# Patient Record
Sex: Male | Born: 1995 | Race: White | Hispanic: No | Marital: Single | Smoking: Current some day smoker
Health system: Southern US, Community
[De-identification: ages and names within clinical notes are randomized; demographics above are authoritative.]

---

## 2018-12-30 ENCOUNTER — Emergency Department
Admission: EM | Admit: 2018-12-30 | Discharge: 2018-12-30 | Disposition: A | Discharge status: Home / Self Care | Payer: PRIVATE HEALTH INSURANCE | Attending: Emergency Medicine | Admitting: Emergency Medicine

## 2018-12-30 ENCOUNTER — Encounter: Payer: Self-pay | Admitting: Emergency Medicine

## 2018-12-30 ENCOUNTER — Other Ambulatory Visit: Payer: Self-pay

## 2018-12-30 ENCOUNTER — Emergency Department: Payer: PRIVATE HEALTH INSURANCE

## 2018-12-30 DIAGNOSIS — R0781 Pleurodynia: Secondary | ICD-10-CM

## 2018-12-30 DIAGNOSIS — J020 Streptococcal pharyngitis: Secondary | ICD-10-CM | POA: Diagnosis not present

## 2018-12-30 DIAGNOSIS — R1012 Left upper quadrant pain: Secondary | ICD-10-CM | POA: Insufficient documentation

## 2018-12-30 DIAGNOSIS — Z20828 Contact with and (suspected) exposure to other viral communicable diseases: Secondary | ICD-10-CM | POA: Diagnosis not present

## 2018-12-30 DIAGNOSIS — J189 Pneumonia, unspecified organism: Secondary | ICD-10-CM | POA: Diagnosis not present

## 2018-12-30 DIAGNOSIS — F17200 Nicotine dependence, unspecified, uncomplicated: Secondary | ICD-10-CM | POA: Insufficient documentation

## 2018-12-30 DIAGNOSIS — M549 Dorsalgia, unspecified: Secondary | ICD-10-CM | POA: Diagnosis present

## 2018-12-30 LAB — URINALYSIS, COMPLETE (UACMP) WITH MICROSCOPIC
Bacteria, UA: NONE SEEN
Bilirubin Urine: NEGATIVE
Glucose, UA: NEGATIVE mg/dL
Hgb urine dipstick: NEGATIVE
Ketones, ur: 20 mg/dL — AB
Leukocytes,Ua: NEGATIVE
Nitrite: NEGATIVE
Protein, ur: 30 mg/dL — AB
Specific Gravity, Urine: 1.033 — ABNORMAL HIGH (ref 1.005–1.030)
pH: 6 (ref 5.0–8.0)

## 2018-12-30 LAB — COMPREHENSIVE METABOLIC PANEL
ALT: 14 U/L (ref 0–44)
AST: 17 U/L (ref 15–41)
Albumin: 4.2 g/dL (ref 3.5–5.0)
Alkaline Phosphatase: 66 U/L (ref 38–126)
Anion gap: 13 (ref 5–15)
BUN: 13 mg/dL (ref 6–20)
CO2: 22 mmol/L (ref 22–32)
Calcium: 9.3 mg/dL (ref 8.9–10.3)
Chloride: 102 mmol/L (ref 98–111)
Creatinine, Ser: 0.81 mg/dL (ref 0.61–1.24)
GFR calc Af Amer: >60 mL/min (ref >60)
GFR calc non Af Amer: >60 mL/min (ref >60)
Glucose, Bld: 121 mg/dL — ABNORMAL HIGH (ref 70–99)
Potassium: 3.9 mmol/L (ref 3.5–5.1)
Sodium: 137 mmol/L (ref 135–145)
Total Bilirubin: 1.4 mg/dL — ABNORMAL HIGH (ref 0.3–1.2)
Total Protein: 8.4 g/dL — ABNORMAL HIGH (ref 6.5–8.1)

## 2018-12-30 LAB — CBC WITH DIFFERENTIAL/PLATELET
Abs Immature Granulocytes: 0.06 10*3/uL (ref 0.00–0.07)
Basophils Absolute: 0 10*3/uL (ref 0.0–0.1)
Basophils Relative: 0 %
Eosinophils Absolute: 0.1 10*3/uL (ref 0.0–0.5)
Eosinophils Relative: 0 %
HCT: 40.1 % (ref 39.0–52.0)
Hemoglobin: 13.9 g/dL (ref 13.0–17.0)
Immature Granulocytes: 0 %
Lymphocytes Relative: 6 %
Lymphs Abs: 0.7 10*3/uL (ref 0.7–4.0)
MCH: 30.4 pg (ref 26.0–34.0)
MCHC: 34.7 g/dL (ref 30.0–36.0)
MCV: 87.7 fL (ref 80.0–100.0)
Monocytes Absolute: 1.5 10*3/uL — ABNORMAL HIGH (ref 0.1–1.0)
Monocytes Relative: 11 %
Neutro Abs: 11 10*3/uL — ABNORMAL HIGH (ref 1.7–7.7)
Neutrophils Relative %: 83 %
Platelets: 153 10*3/uL (ref 150–400)
RBC: 4.57 MIL/uL (ref 4.22–5.81)
RDW: 11.9 % (ref 11.5–15.5)
WBC: 13.4 10*3/uL — ABNORMAL HIGH (ref 4.0–10.5)
nRBC: 0 % (ref 0.0–0.2)

## 2018-12-30 LAB — MONONUCLEOSIS SCREEN: Mono Screen: NEGATIVE

## 2018-12-30 MED ORDER — SODIUM CHLORIDE 0.9 % IV BOLUS
1000.0000 mL | Freq: Once | INTRAVENOUS | Status: AC
  Administered 2018-12-30: 1000 mL via INTRAVENOUS

## 2018-12-30 MED ORDER — LEVOFLOXACIN 750 MG PO TABS: 750.0000 mg | ORAL_TABLET | Freq: Every day | ORAL | 0 refills | Status: AC

## 2018-12-30 MED ORDER — DEXAMETHASONE SODIUM PHOSPHATE 10 MG/ML IJ SOLN
10.0000 mg | Freq: Once | INTRAMUSCULAR | Status: AC
  Administered 2018-12-30: 10 mg via INTRAVENOUS
  Filled 2018-12-30: qty 1

## 2018-12-30 MED ORDER — ONDANSETRON HCL 4 MG/2ML IJ SOLN
4.0000 mg | Freq: Once | INTRAMUSCULAR | Status: AC
  Administered 2018-12-30: 4 mg via INTRAVENOUS
  Filled 2018-12-30: qty 2

## 2018-12-30 MED ORDER — MORPHINE SULFATE (PF) 4 MG/ML IV SOLN
4.0000 mg | Freq: Once | INTRAVENOUS | Status: AC
  Administered 2018-12-30: 4 mg via INTRAVENOUS
  Filled 2018-12-30: qty 1

## 2018-12-30 NOTE — ED Triage Notes (Signed)
Pt to ER via POV states recent diagnosis of strep and awoke this AM with left sided back/flank pain.  PT states pain is worse with dep breath and movement.

## 2018-12-30 NOTE — Discharge Instructions (Signed)
Please take the antibiotic as prescribed and until finished. Your COVID-19 test will be sent out and you will be called with the results. You must quarantine yourself for the next 7 days plus 3 days after fever without tylenol or ibuprofen. Return to the ER for symptoms that change or worsen if unable to see primary care.

## 2018-12-30 NOTE — ED Provider Notes (Signed)
[facility] Emergency Department Provider Note  ____________________________________________  Time seen: Approximately 1:44 PM  I have reviewed the triage vital signs and the nursing notes.   HISTORY  Chief Complaint Back Pain    HPI Eric Ramirez is a 23 y.o. male who presents to the emergency department for treatment and evaluation of sore throat and left upper quadrant abdominal pain. He was diagnosed with strep throat about 5 days ago and is currently taking amoxicillin. Over the past couple of days, he has felt worse. Today he has pain in the left upper abdomen with movement and deep breath. He reports an intermittent fever as well. No other sick contacts. No cough, shortness of breath or other symptoms of concern.  History reviewed. No pertinent past medical history.  There are no active problems to display for this patient.   History reviewed. No pertinent surgical history.  Prior to Admission medications   Medication Sig Start Date End Date Taking? Authorizing Provider  levofloxacin (LEVAQUIN) 750 MG tablet Take 1 tablet (750 mg total) by mouth daily for 7 days. 12/30/18 01/06/19  Chinita Pester, FNP    Allergies Patient has no allergy information on record.  History reviewed. No pertinent family history.  Social History Social History   Tobacco Use  . Smoking status: Current Some Day Smoker  . Smokeless tobacco: Never Used  Substance Use Topics  . Alcohol use: Not on file  . Drug use: Not on file    Review of Systems Constitutional: Positive for fever. Eyes: No visual changes. ENT: Positive for sore throat; negative for difficulty swallowing. Respiratory: Denies shortness of breath. Gastrointestinal: Positive for abdominal pain.  No nausea, no vomiting.  No diarrhea.  Genitourinary: Negative for dysuria. Positive for decrease in need to void. Musculoskeletal: Positive for generalized body aches. Skin: Negative for  rash. Neurological: Negative for headaches, negative for focal weakness or numbness.  ____________________________________________   PHYSICAL EXAM:  VITAL SIGNS: ED Triage Vitals  Enc Vitals Group     BP 12/30/18 1155 135/85     Pulse Rate 12/30/18 1155 86     Resp 12/30/18 1155 16     Temp 12/30/18 1155 98.8 F (37.1 C)     Temp Source 12/30/18 1155 Oral     SpO2 12/30/18 1155 97 %     Weight 12/30/18 1157 190 lb (86.2 kg)     Height 12/30/18 1157 6\' 5"  (1.956 m)     Head Circumference --      Peak Flow --      Pain Score 12/30/18 1156 0     Pain Loc --      Pain Edu? --      Excl. in GC? --     Constitutional: Alert and oriented. Well appearing and in no acute distress. Eyes: Conjunctivae are normal.  Head: Atraumatic. Nose: No congestion/rhinnorhea. Mouth/Throat: Mucous membranes are moist.  Oropharynx erythematous, tonsils 3+ with exudate and sloughing. Uvula is midline. Ears: Left tympanic membrane appears normal. Right tympanic membrane appears normal. Neck: No stridor. Voice slightly muffled. Lymphatic: Anterior cervical nodes Tender and palpable bilaterally. Cardiovascular: Normal rate, regular rhythm. Good peripheral circulation. Respiratory: Normal respiratory effort. Lungs CTAB. Gastrointestinal: Soft. Guarding in the left upper abdomen. Musculoskeletal: FROM of neck, upper and lower extremities. Neurologic:  Normal speech and language. No gross focal neurologic deficits are appreciated. Skin:  Skin is warm, dry and intact. No rash noted Psychiatric: Mood and affect are normal. Speech and behavior are  normal.  ____________________________________________   LABS (all labs ordered are listed, but only abnormal results are displayed)  Labs Reviewed  COMPREHENSIVE METABOLIC PANEL - Abnormal; Notable for the following components:      Result Value   Glucose, Bld 121 (*)    Total Protein 8.4 (*)    Total Bilirubin 1.4 (*)    All other components within  normal limits  CBC WITH DIFFERENTIAL/PLATELET - Abnormal; Notable for the following components:   WBC 13.4 (*)    Neutro Abs 11.0 (*)    Monocytes Absolute 1.5 (*)    All other components within normal limits  URINALYSIS, COMPLETE (UACMP) WITH MICROSCOPIC - Abnormal; Notable for the following components:   Color, Urine AMBER (*)    APPearance HAZY (*)    Specific Gravity, Urine 1.033 (*)    Ketones, ur 20 (*)    Protein, ur 30 (*)    All other components within normal limits  NOVEL CORONAVIRUS, NAA (HOSPITAL ORDER, SEND-OUT TO REF LAB)  MONONUCLEOSIS SCREEN  EPSTEIN BARR VRS(EBV DNA BY PCR)   ____________________________________________  EKG  Not indiated ____________________________________________  RADIOLOGY  Not indicated. ____________________________________________   PROCEDURES  Procedure(s) performed: None  Critical Care performed: No ____________________________________________   INITIAL IMPRESSION / ASSESSMENT AND PLAN / ED COURSE  23 year old male presenting to the emergency department for treatment and evaluation of sore throat and abdominal pain. Exam is concerning for strep and possibly concurrent mononucleosis. Because of the left upper abdominal pain, splenic ultrasound will be performed. Urine will also be requested.   Differential diagnosis includes, but not limited to strep pharyngitis, mononucleosis, splenomegaly, and glomerulonephritis.  ----------------------------------------- 2:00 PM on 12/30/2018 ----------------------------------------- Patient feeling some better and is requesting fluids to drink. Lab and ultrasound results reviewed with the patient. Will plan to discharge after urinalysis is resulted.   ----------------------------------------- 4:07 PM on 12/30/2018 ----------------------------------------- Case discussed with Dr. Cyril LoosenKinner. Chest x-ray added and shows left perihilar opacity concerning for pneumonia. COVID-19 testing will be  obtained for send out. He will be treated with Levaquin and advised to take tylenol for fever or body aches. He is to quarantine himself for 7 days plus 3 days after fever without tylenol. Patient requested that his results be discussed with his mother. I spoke to her on the phone in the presence of the patient. Patient is to return to the ER for symptoms that change or worsen or for new symptoms of concern.  Pertinent labs & imaging results that were available during my care of the patient were reviewed by me and considered in my medical decision making (see chart for details). ____________________________________________  Discharge Medication List as of 12/30/2018  3:53 PM    START taking these medications   Details  levofloxacin (LEVAQUIN) 750 MG tablet Take 1 tablet (750 mg total) by mouth daily for 7 days., Starting Sat 12/30/2018, Until Sat 01/06/2019, Print        FINAL CLINICAL IMPRESSION(S) / ED DIAGNOSES  Final diagnoses:  Left upper quadrant pain  Pharyngitis due to Streptococcus species  Pleuritic chest pain  Pneumonia of left lung due to infectious organism, unspecified part of lung    If controlled substance prescribed during this visit, 12 month history viewed on the [facility] prior to issuing an initial prescription for Schedule II or III opiod.   Note:  This document was prepared using Dragon voice recognition software and may include unintentional dictation errors.   Chinita Pesterriplett, Milam Allbaugh B, FNP 12/30/18 1712  Jene Every, MD 12/30/18 [phone]

## 2018-12-31 LAB — NOVEL CORONAVIRUS, NAA (HOSP ORDER, SEND-OUT TO REF LAB; TAT 18-24 HRS): SARS-CoV-2, NAA: NOT DETECTED

## 2019-01-01 NOTE — ED Notes (Signed)
Called patient and informed him of negative covid 19 result. 

## 2019-01-03 LAB — EPSTEIN BARR VRS(EBV DNA BY PCR)
EBV DNA QN by PCR: NEGATIVE copies/mL
log10 EBV DNA Qn PCR: UNDETERMINED log10 copy/mL

## 2019-05-08 ENCOUNTER — Other Ambulatory Visit: Payer: Self-pay

## 2019-05-08 DIAGNOSIS — Z20822 Contact with and (suspected) exposure to covid-19: Secondary | ICD-10-CM

## 2019-05-09 LAB — NOVEL CORONAVIRUS, NAA: SARS-CoV-2, NAA: NOT DETECTED

## 2019-05-31 ENCOUNTER — Other Ambulatory Visit: Payer: Self-pay | Admitting: *Deleted

## 2019-05-31 DIAGNOSIS — Z20822 Contact with and (suspected) exposure to covid-19: Secondary | ICD-10-CM

## 2019-06-01 LAB — NOVEL CORONAVIRUS, NAA: SARS-CoV-2, NAA: NOT DETECTED

## 2019-06-26 ENCOUNTER — Other Ambulatory Visit: Payer: Self-pay | Admitting: *Deleted

## 2019-06-26 DIAGNOSIS — Z20822 Contact with and (suspected) exposure to covid-19: Secondary | ICD-10-CM

## 2019-06-28 LAB — NOVEL CORONAVIRUS, NAA: SARS-CoV-2, NAA: NOT DETECTED

## 2020-11-22 IMAGING — DX PORTABLE CHEST - 1 VIEW
2 series · 2 of 2 positions shown · non-contrast
Comparison: None.

CLINICAL DATA: Back pain and flank pain.

EXAM:
PORTABLE CHEST 1 VIEW

[chest ap (1 of 2)]
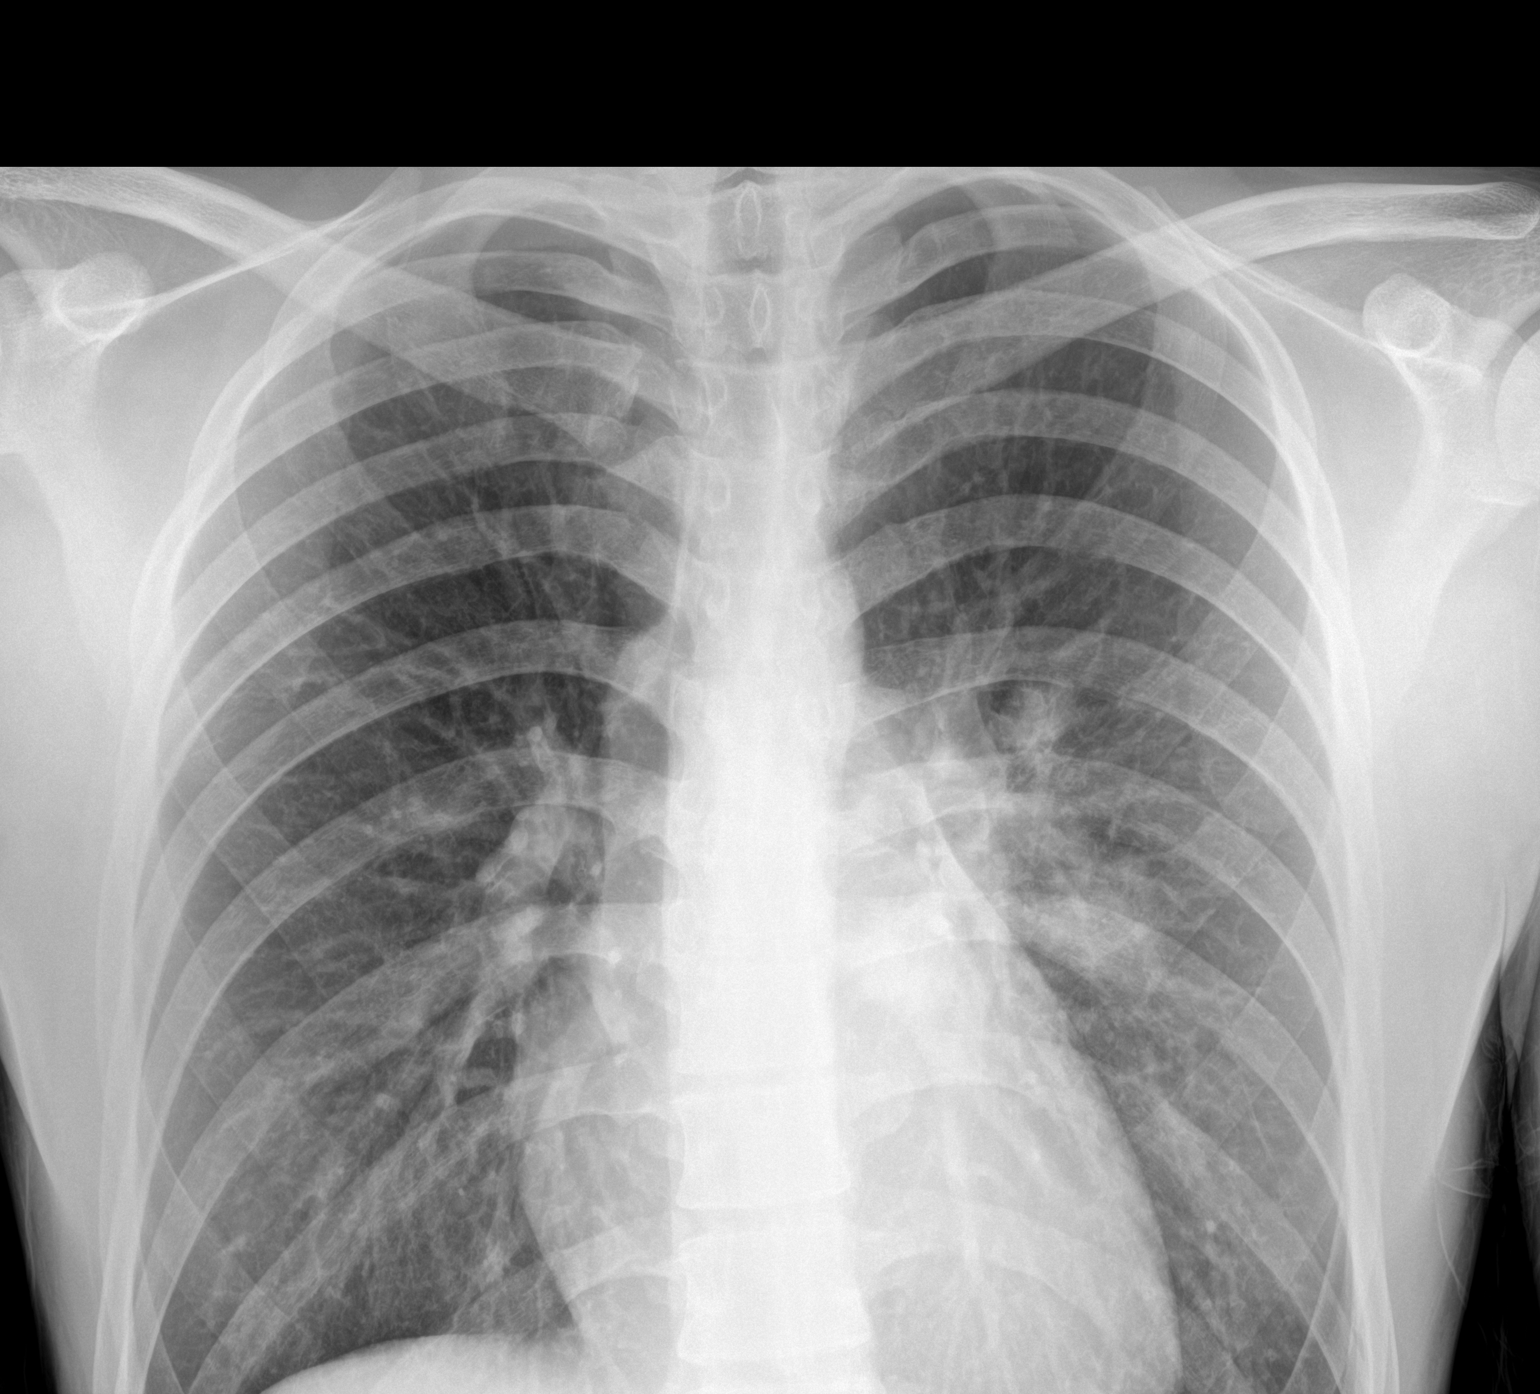

[chest ap (2 of 2)]
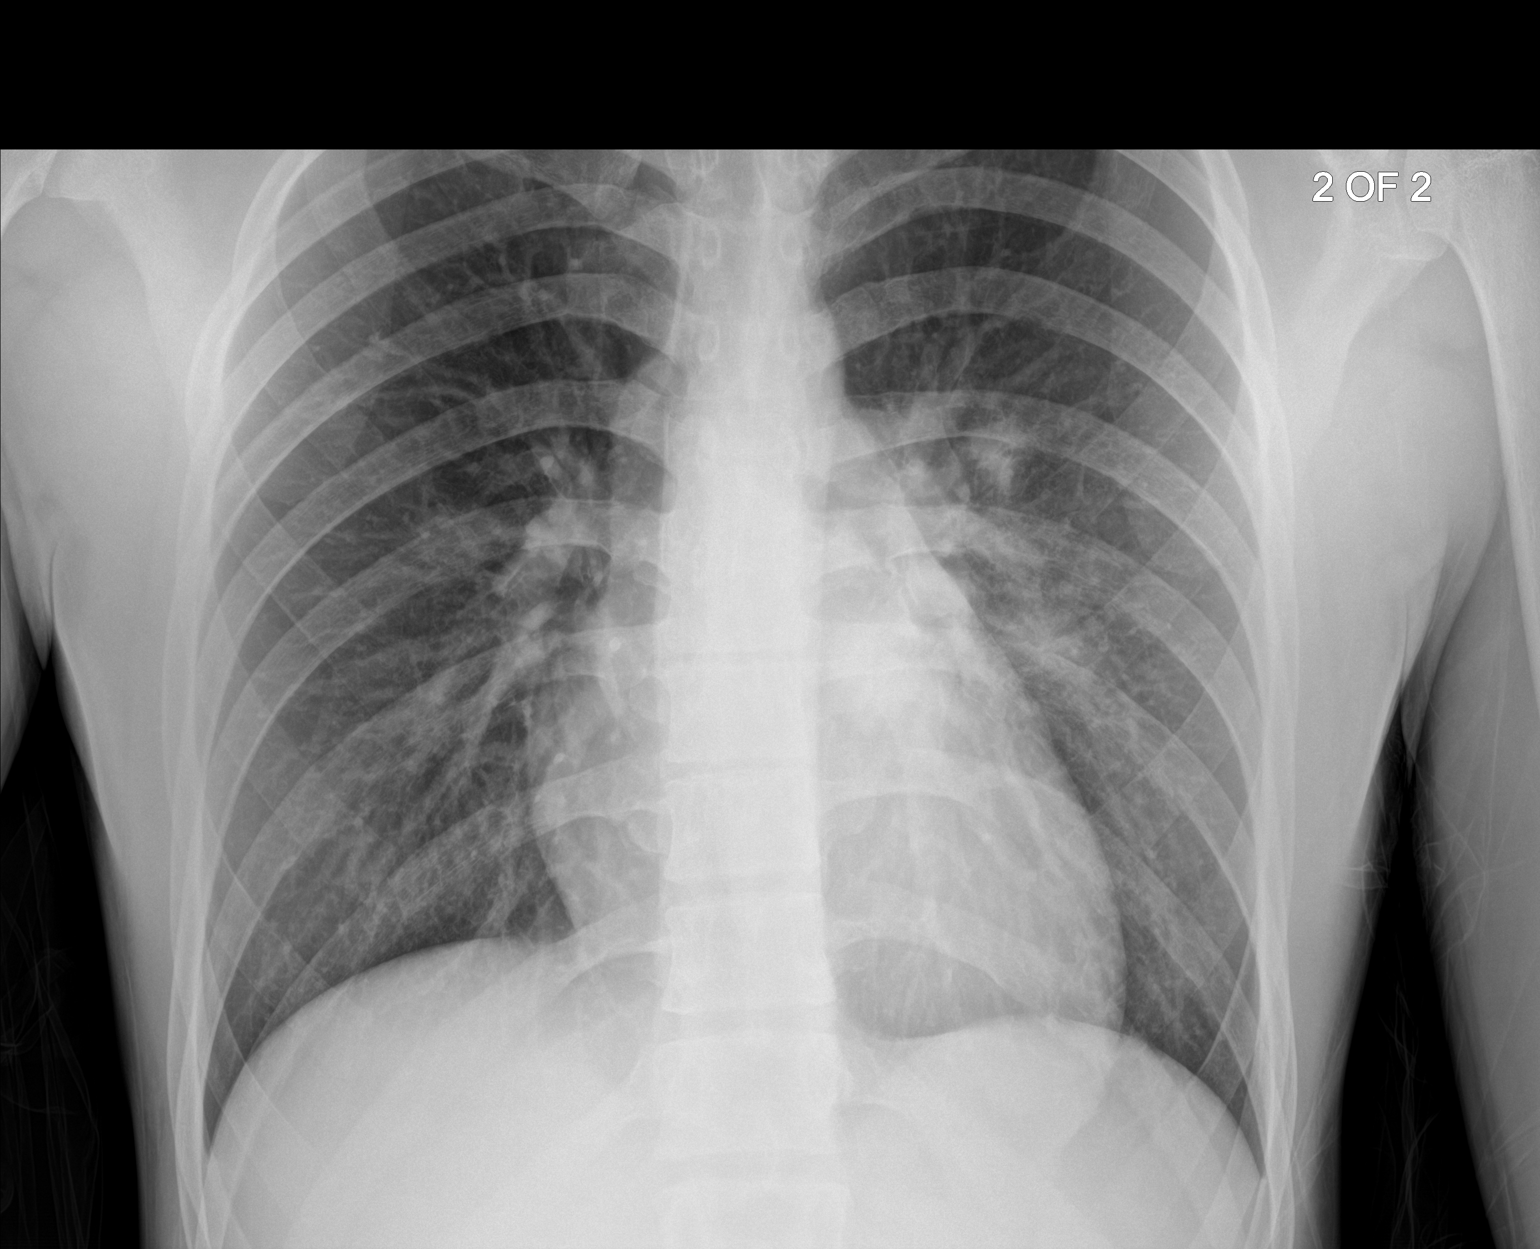

[2 of 2 positions shown; findings below may reference images not displayed]

FINDINGS: There are at least 2 ill-defined opacities in the left perihilar
region. Normal heart size. Right lung is clear. No pneumothorax or
pleural effusion.
IMPRESSION: Findings are worrisome for left perihilar airspace opacities and/or
bronchopneumonia. Followup PA and lateral chest X-ray is recommended
in 3-4 weeks following trial of antibiotic therapy to ensure
resolution and exclude underlying malignancy.

## 2021-02-10 IMAGING — US ULTRASOUND ABDOMEN LIMITED
1 series · 7 of 7 positions shown · non-contrast
Comparison: None.

CLINICAL DATA: Left upper quadrant pain

EXAM:
ULTRASOUND ABDOMEN LIMITED OF THE SPLEEN

[Series 1: ultrasound abdomen limited · 0.23mm/px · 7 of 7 slices shown]
[im 1/7]
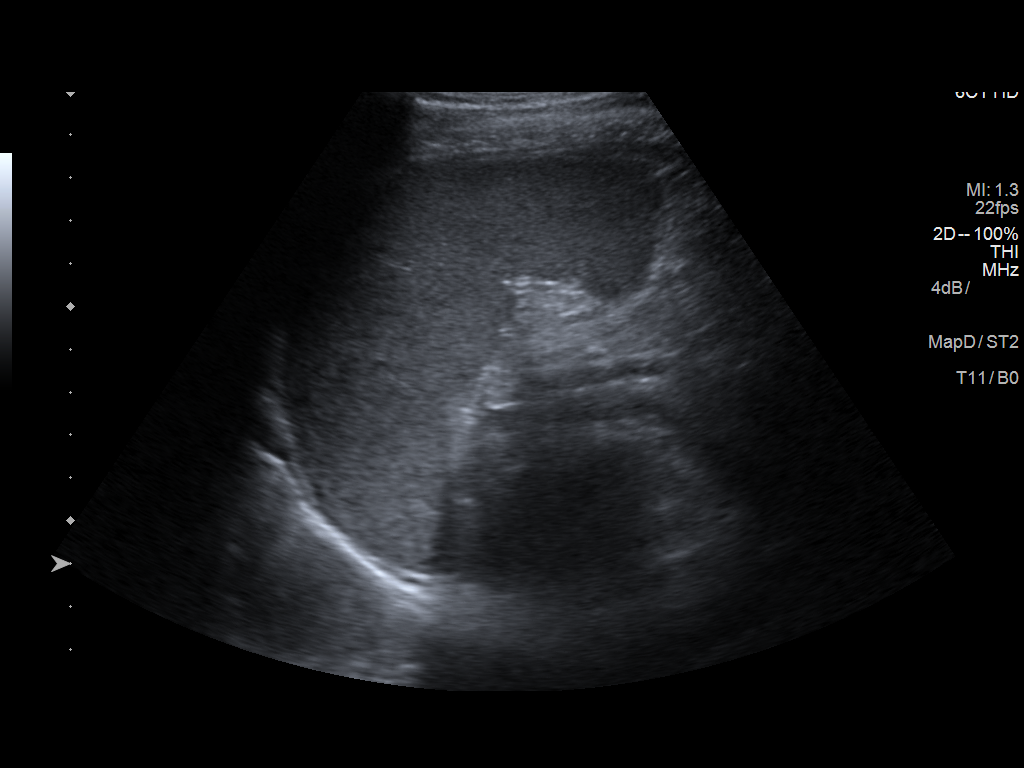
[im 2/7]
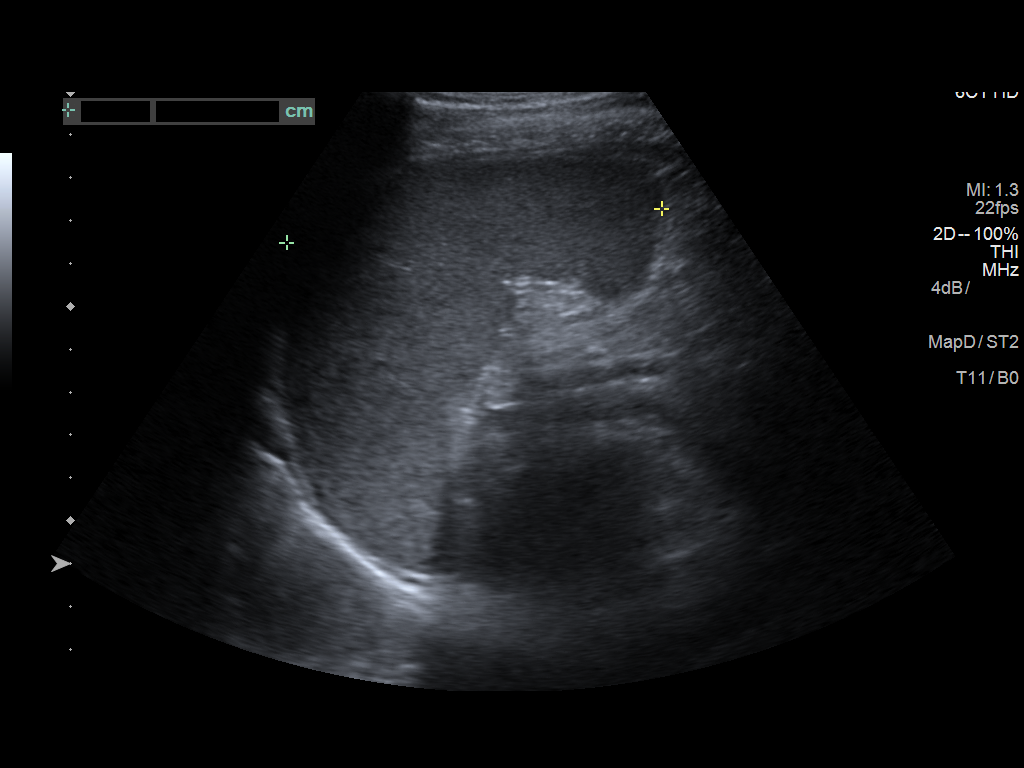
[im 3/7]
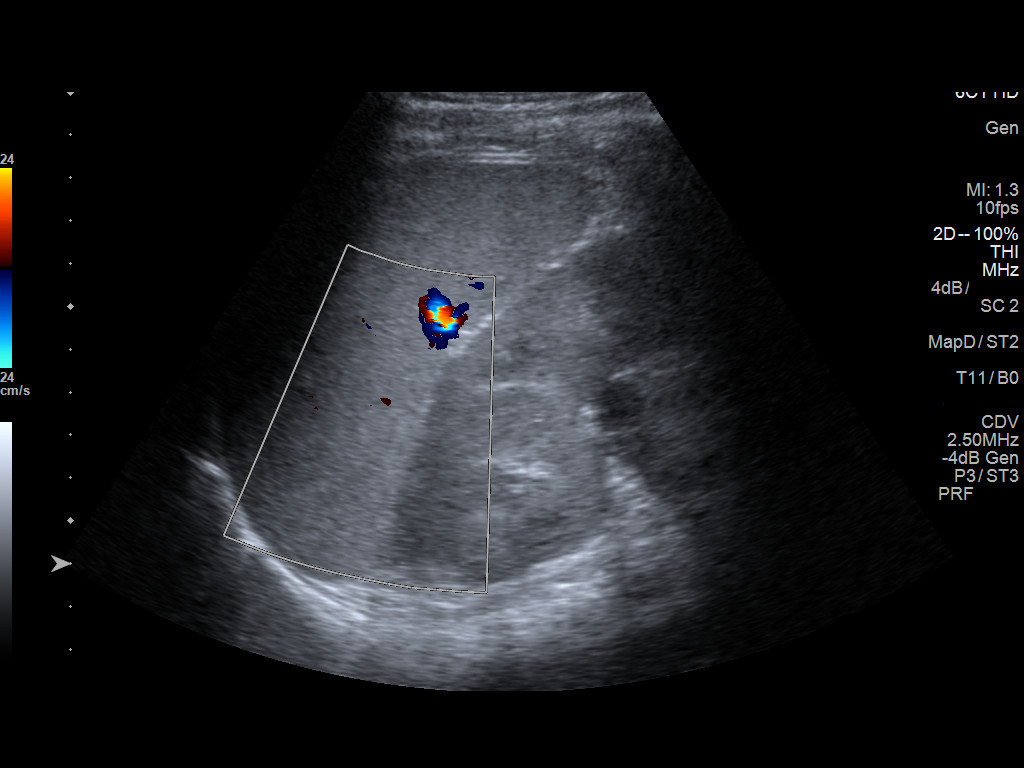
[im 4/7]
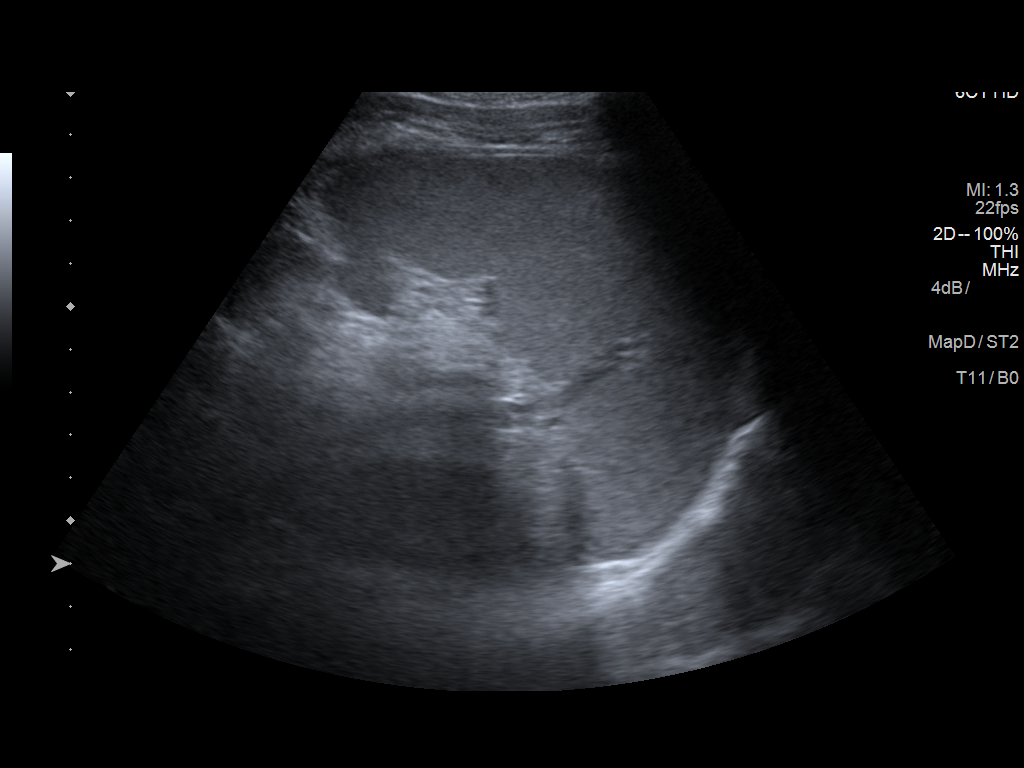
[im 5/7]
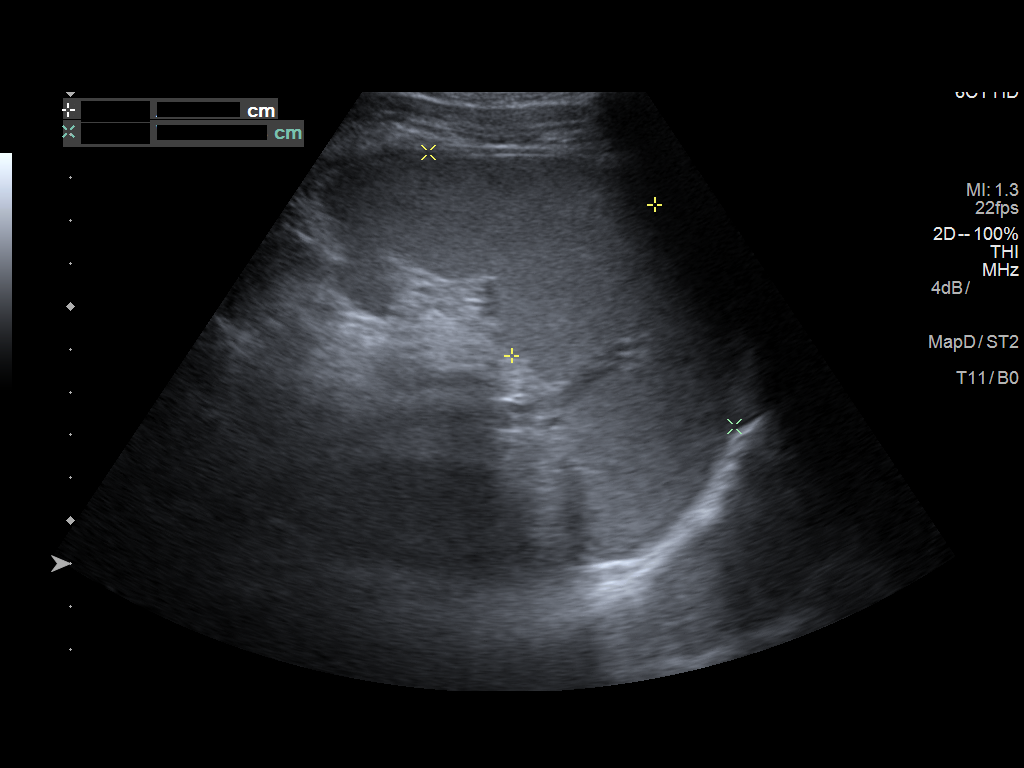
[im 6/7]
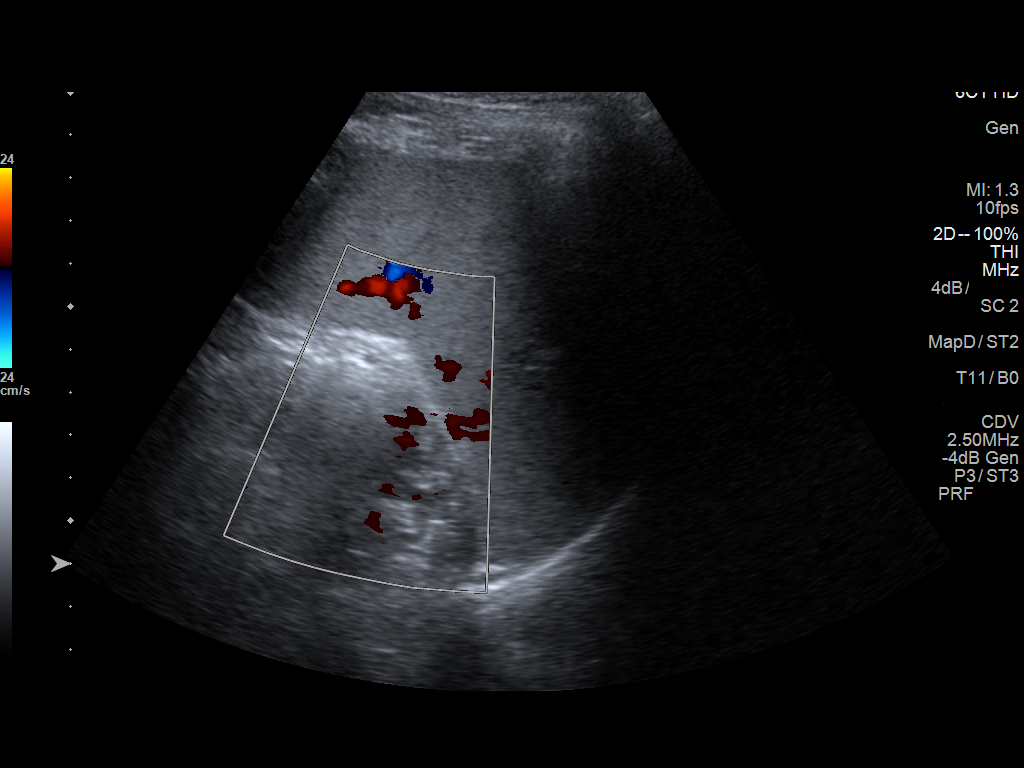
[im 7/7]
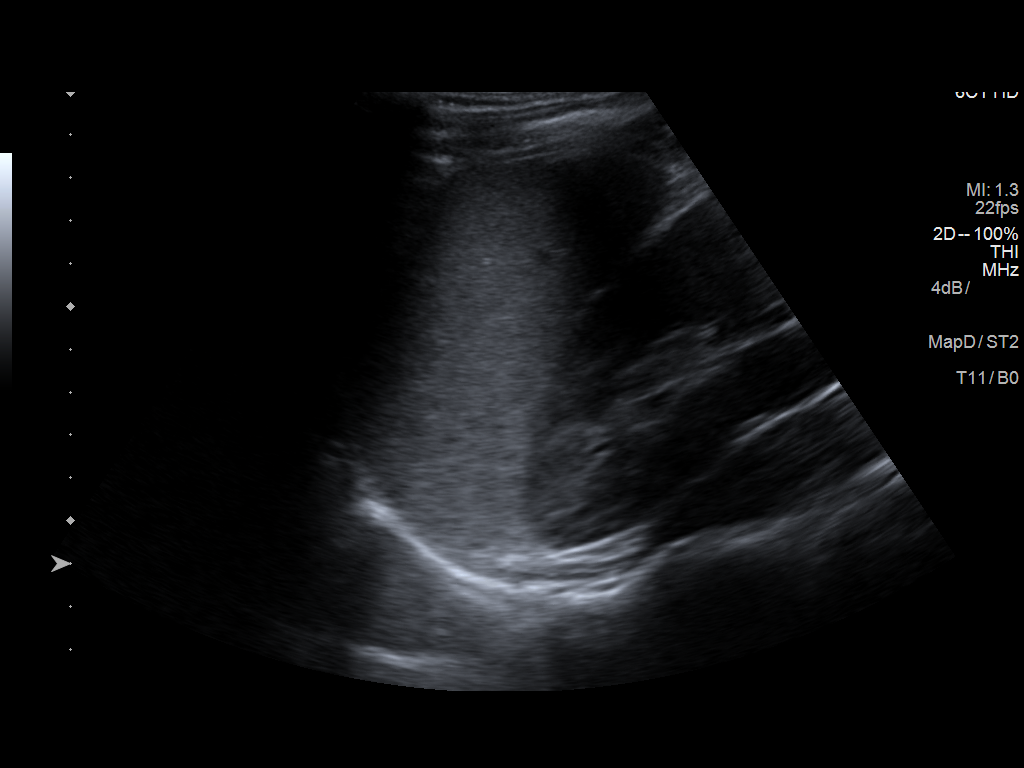

[7 of 7 positions shown; findings below may reference images not displayed]

FINDINGS: The spleen measures 8.8 x 4.8 x 11.4 cm with a volume of 240.8 cc.
There is no focal mass.
IMPRESSION: Within normal limits.
